# Patient Record
Sex: Male | Born: 1995
Health system: Southern US, Community
[De-identification: ages and names within clinical notes are randomized; demographics above are authoritative.]

## PROBLEM LIST (undated history)

## (undated) DIAGNOSIS — G4730 Sleep apnea, unspecified: Secondary | ICD-10-CM

## (undated) DIAGNOSIS — F419 Anxiety disorder, unspecified: Secondary | ICD-10-CM

## (undated) DIAGNOSIS — K219 Gastro-esophageal reflux disease without esophagitis: Secondary | ICD-10-CM

## (undated) DIAGNOSIS — F32A Depression, unspecified: Secondary | ICD-10-CM

## (undated) HISTORY — PX: NO PAST SURGERIES: SHX2092

## (undated) HISTORY — DX: Depression, unspecified: F32.A

## (undated) HISTORY — DX: Sleep apnea, unspecified: G47.30

## (undated) HISTORY — DX: Gastro-esophageal reflux disease without esophagitis: K21.9

## (undated) HISTORY — DX: Anxiety disorder, unspecified: F41.9

---
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
## 2017-01-10 DIAGNOSIS — F332 Major depressive disorder, recurrent severe without psychotic features: Secondary | ICD-10-CM | POA: Diagnosis not present

## 2017-02-17 DIAGNOSIS — F332 Major depressive disorder, recurrent severe without psychotic features: Secondary | ICD-10-CM | POA: Diagnosis not present

## 2017-03-17 DIAGNOSIS — F332 Major depressive disorder, recurrent severe without psychotic features: Secondary | ICD-10-CM | POA: Diagnosis not present

## 2018-03-13 DIAGNOSIS — D1722 Benign lipomatous neoplasm of skin and subcutaneous tissue of left arm: Secondary | ICD-10-CM | POA: Diagnosis not present

## 2018-03-13 DIAGNOSIS — D179 Benign lipomatous neoplasm, unspecified: Secondary | ICD-10-CM | POA: Diagnosis not present

## 2018-03-14 ENCOUNTER — Other Ambulatory Visit: Payer: Self-pay | Admitting: Physician Assistant

## 2018-03-14 DIAGNOSIS — D1722 Benign lipomatous neoplasm of skin and subcutaneous tissue of left arm: Secondary | ICD-10-CM

## 2018-03-17 ENCOUNTER — Ambulatory Visit
Admission: RE | Admit: 2018-03-17 | Discharge: 2018-03-17 | Disposition: A | Discharge status: Home / Self Care | Payer: BLUE CROSS/BLUE SHIELD | Source: Ambulatory Visit | Attending: Physician Assistant | Admitting: Physician Assistant

## 2018-03-17 DIAGNOSIS — D1722 Benign lipomatous neoplasm of skin and subcutaneous tissue of left arm: Secondary | ICD-10-CM

## 2018-03-17 DIAGNOSIS — R2231 Localized swelling, mass and lump, right upper limb: Secondary | ICD-10-CM | POA: Diagnosis not present

## 2018-05-25 DIAGNOSIS — J3489 Other specified disorders of nose and nasal sinuses: Secondary | ICD-10-CM | POA: Diagnosis not present

## 2018-05-25 DIAGNOSIS — R0989 Other specified symptoms and signs involving the circulatory and respiratory systems: Secondary | ICD-10-CM | POA: Diagnosis not present

## 2018-12-21 DIAGNOSIS — F419 Anxiety disorder, unspecified: Secondary | ICD-10-CM | POA: Diagnosis not present

## 2018-12-27 DIAGNOSIS — F4310 Post-traumatic stress disorder, unspecified: Secondary | ICD-10-CM | POA: Diagnosis not present

## 2019-01-11 DIAGNOSIS — F411 Generalized anxiety disorder: Secondary | ICD-10-CM | POA: Diagnosis not present

## 2019-01-11 DIAGNOSIS — F4310 Post-traumatic stress disorder, unspecified: Secondary | ICD-10-CM | POA: Diagnosis not present

## 2019-01-18 DIAGNOSIS — F4310 Post-traumatic stress disorder, unspecified: Secondary | ICD-10-CM | POA: Diagnosis not present

## 2019-01-18 DIAGNOSIS — F411 Generalized anxiety disorder: Secondary | ICD-10-CM | POA: Diagnosis not present

## 2019-01-25 DIAGNOSIS — F411 Generalized anxiety disorder: Secondary | ICD-10-CM | POA: Diagnosis not present

## 2019-01-25 DIAGNOSIS — F4310 Post-traumatic stress disorder, unspecified: Secondary | ICD-10-CM | POA: Diagnosis not present

## 2019-01-26 DIAGNOSIS — H60501 Unspecified acute noninfective otitis externa, right ear: Secondary | ICD-10-CM | POA: Diagnosis not present

## 2019-01-26 DIAGNOSIS — H6691 Otitis media, unspecified, right ear: Secondary | ICD-10-CM | POA: Diagnosis not present

## 2019-02-07 DIAGNOSIS — Z79891 Long term (current) use of opiate analgesic: Secondary | ICD-10-CM | POA: Diagnosis not present

## 2019-02-07 DIAGNOSIS — F411 Generalized anxiety disorder: Secondary | ICD-10-CM | POA: Diagnosis not present

## 2019-02-07 DIAGNOSIS — F3342 Major depressive disorder, recurrent, in full remission: Secondary | ICD-10-CM | POA: Diagnosis not present

## 2019-02-07 DIAGNOSIS — F4310 Post-traumatic stress disorder, unspecified: Secondary | ICD-10-CM | POA: Diagnosis not present

## 2019-02-08 DIAGNOSIS — F411 Generalized anxiety disorder: Secondary | ICD-10-CM | POA: Diagnosis not present

## 2019-02-08 DIAGNOSIS — F4310 Post-traumatic stress disorder, unspecified: Secondary | ICD-10-CM | POA: Diagnosis not present

## 2019-02-14 DIAGNOSIS — F4310 Post-traumatic stress disorder, unspecified: Secondary | ICD-10-CM | POA: Diagnosis not present

## 2019-02-14 DIAGNOSIS — F411 Generalized anxiety disorder: Secondary | ICD-10-CM | POA: Diagnosis not present

## 2019-02-21 DIAGNOSIS — F411 Generalized anxiety disorder: Secondary | ICD-10-CM | POA: Diagnosis not present

## 2019-02-21 DIAGNOSIS — F4310 Post-traumatic stress disorder, unspecified: Secondary | ICD-10-CM | POA: Diagnosis not present

## 2019-02-28 DIAGNOSIS — F411 Generalized anxiety disorder: Secondary | ICD-10-CM | POA: Diagnosis not present

## 2019-02-28 DIAGNOSIS — F4310 Post-traumatic stress disorder, unspecified: Secondary | ICD-10-CM | POA: Diagnosis not present

## 2019-03-07 DIAGNOSIS — F4310 Post-traumatic stress disorder, unspecified: Secondary | ICD-10-CM | POA: Diagnosis not present

## 2019-03-07 DIAGNOSIS — F411 Generalized anxiety disorder: Secondary | ICD-10-CM | POA: Diagnosis not present

## 2019-03-12 DIAGNOSIS — F3342 Major depressive disorder, recurrent, in full remission: Secondary | ICD-10-CM | POA: Diagnosis not present

## 2019-03-12 DIAGNOSIS — F411 Generalized anxiety disorder: Secondary | ICD-10-CM | POA: Diagnosis not present

## 2019-03-12 DIAGNOSIS — F4310 Post-traumatic stress disorder, unspecified: Secondary | ICD-10-CM | POA: Diagnosis not present

## 2019-03-28 DIAGNOSIS — F411 Generalized anxiety disorder: Secondary | ICD-10-CM | POA: Diagnosis not present

## 2019-03-28 DIAGNOSIS — F4310 Post-traumatic stress disorder, unspecified: Secondary | ICD-10-CM | POA: Diagnosis not present

## 2019-04-13 DIAGNOSIS — F411 Generalized anxiety disorder: Secondary | ICD-10-CM | POA: Diagnosis not present

## 2019-04-13 DIAGNOSIS — F4310 Post-traumatic stress disorder, unspecified: Secondary | ICD-10-CM | POA: Diagnosis not present

## 2019-04-27 DIAGNOSIS — F411 Generalized anxiety disorder: Secondary | ICD-10-CM | POA: Diagnosis not present

## 2019-04-27 DIAGNOSIS — F4310 Post-traumatic stress disorder, unspecified: Secondary | ICD-10-CM | POA: Diagnosis not present

## 2019-05-16 DIAGNOSIS — F4310 Post-traumatic stress disorder, unspecified: Secondary | ICD-10-CM | POA: Diagnosis not present

## 2019-05-16 DIAGNOSIS — F411 Generalized anxiety disorder: Secondary | ICD-10-CM | POA: Diagnosis not present

## 2019-05-30 DIAGNOSIS — F4310 Post-traumatic stress disorder, unspecified: Secondary | ICD-10-CM | POA: Diagnosis not present

## 2019-05-30 DIAGNOSIS — F411 Generalized anxiety disorder: Secondary | ICD-10-CM | POA: Diagnosis not present

## 2019-08-29 DIAGNOSIS — R1013 Epigastric pain: Secondary | ICD-10-CM | POA: Diagnosis not present

## 2019-08-29 DIAGNOSIS — R1310 Dysphagia, unspecified: Secondary | ICD-10-CM | POA: Diagnosis not present

## 2019-08-29 DIAGNOSIS — R12 Heartburn: Secondary | ICD-10-CM | POA: Diagnosis not present

## 2019-08-30 ENCOUNTER — Other Ambulatory Visit: Payer: Self-pay | Admitting: Family Medicine

## 2019-08-30 DIAGNOSIS — R1013 Epigastric pain: Secondary | ICD-10-CM

## 2019-09-11 ENCOUNTER — Ambulatory Visit
Admission: RE | Admit: 2019-09-11 | Discharge: 2019-09-11 | Disposition: A | Discharge status: Home / Self Care | Payer: BC Managed Care – PPO | Source: Ambulatory Visit | Attending: Family Medicine | Admitting: Family Medicine

## 2019-09-11 DIAGNOSIS — R109 Unspecified abdominal pain: Secondary | ICD-10-CM | POA: Diagnosis not present

## 2019-09-11 DIAGNOSIS — R1013 Epigastric pain: Secondary | ICD-10-CM

## 2019-09-12 DIAGNOSIS — R1013 Epigastric pain: Secondary | ICD-10-CM | POA: Diagnosis not present

## 2019-10-31 DIAGNOSIS — M7751 Other enthesopathy of right foot: Secondary | ICD-10-CM | POA: Diagnosis not present

## 2019-10-31 DIAGNOSIS — L600 Ingrowing nail: Secondary | ICD-10-CM | POA: Diagnosis not present

## 2019-10-31 DIAGNOSIS — M79671 Pain in right foot: Secondary | ICD-10-CM | POA: Diagnosis not present

## 2019-11-15 DIAGNOSIS — M79675 Pain in left toe(s): Secondary | ICD-10-CM | POA: Diagnosis not present

## 2019-11-15 DIAGNOSIS — L600 Ingrowing nail: Secondary | ICD-10-CM | POA: Diagnosis not present

## 2019-11-15 DIAGNOSIS — M79674 Pain in right toe(s): Secondary | ICD-10-CM | POA: Diagnosis not present

## 2019-11-15 DIAGNOSIS — B351 Tinea unguium: Secondary | ICD-10-CM | POA: Diagnosis not present

## 2019-12-13 DIAGNOSIS — M79675 Pain in left toe(s): Secondary | ICD-10-CM | POA: Diagnosis not present

## 2019-12-13 DIAGNOSIS — M79674 Pain in right toe(s): Secondary | ICD-10-CM | POA: Diagnosis not present

## 2019-12-13 DIAGNOSIS — L600 Ingrowing nail: Secondary | ICD-10-CM | POA: Diagnosis not present

## 2019-12-13 DIAGNOSIS — B351 Tinea unguium: Secondary | ICD-10-CM | POA: Diagnosis not present

## 2020-01-10 DIAGNOSIS — B351 Tinea unguium: Secondary | ICD-10-CM | POA: Diagnosis not present

## 2020-01-10 DIAGNOSIS — L600 Ingrowing nail: Secondary | ICD-10-CM | POA: Diagnosis not present

## 2020-01-10 DIAGNOSIS — M79674 Pain in right toe(s): Secondary | ICD-10-CM | POA: Diagnosis not present

## 2020-01-10 DIAGNOSIS — M79675 Pain in left toe(s): Secondary | ICD-10-CM | POA: Diagnosis not present

## 2020-01-24 IMAGING — US US EXTREM UP*L* LTD
1 series · 5 of 5 positions shown · non-contrast
Comparison: None.

CLINICAL DATA: Medial upper arm mass.

EXAM:
ULTRASOUND LEFT UPPER EXTREMITY LIMITED
TECHNIQUE: Ultrasound examination of the upper extremity soft tissues was
performed in the area of clinical concern.

[Series 1: us extrem up*left* ltd · 0.05mm/px · 5 of 5 slices shown]
[im 1/5]
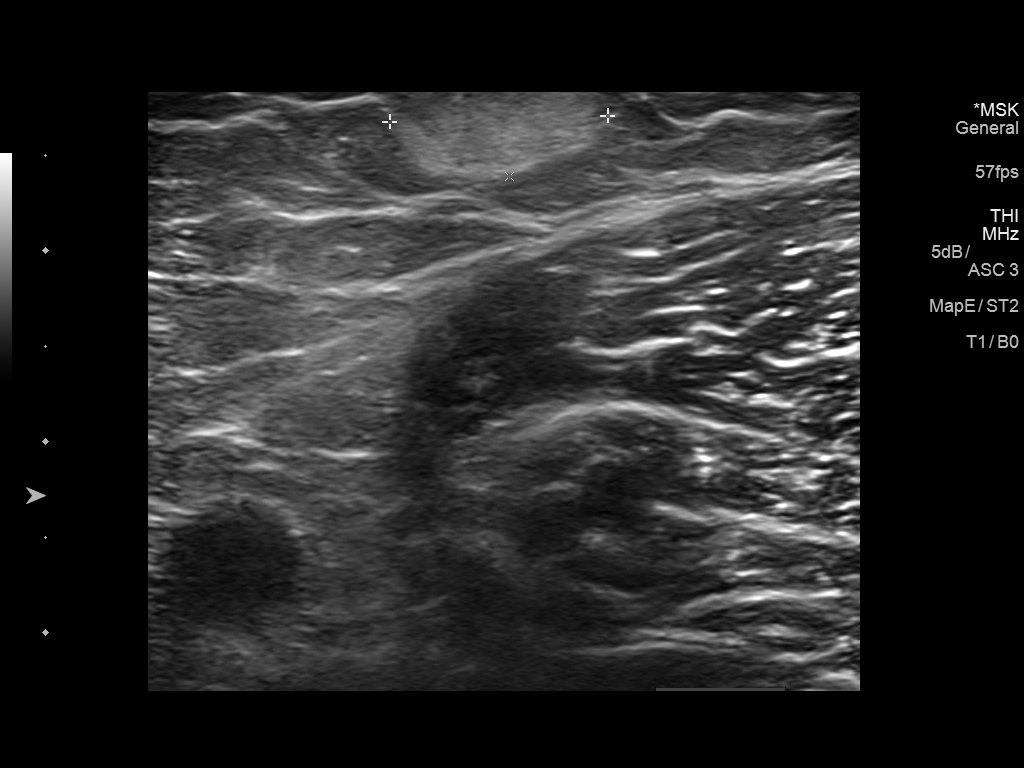
[im 2/5]
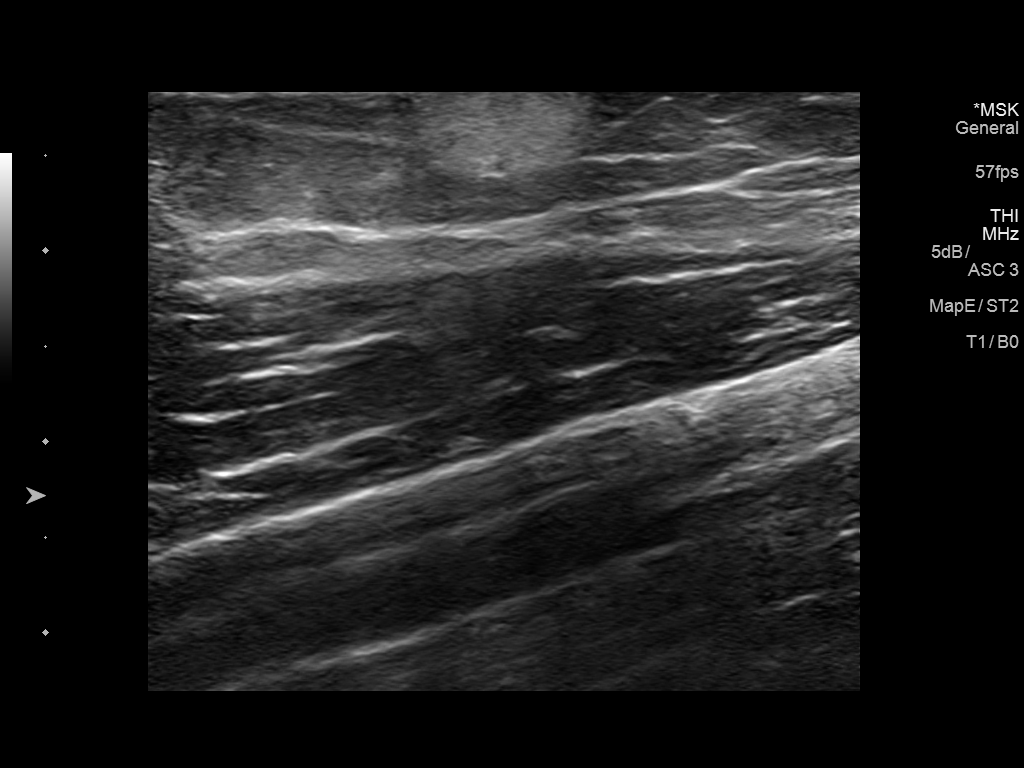
[im 3/5]
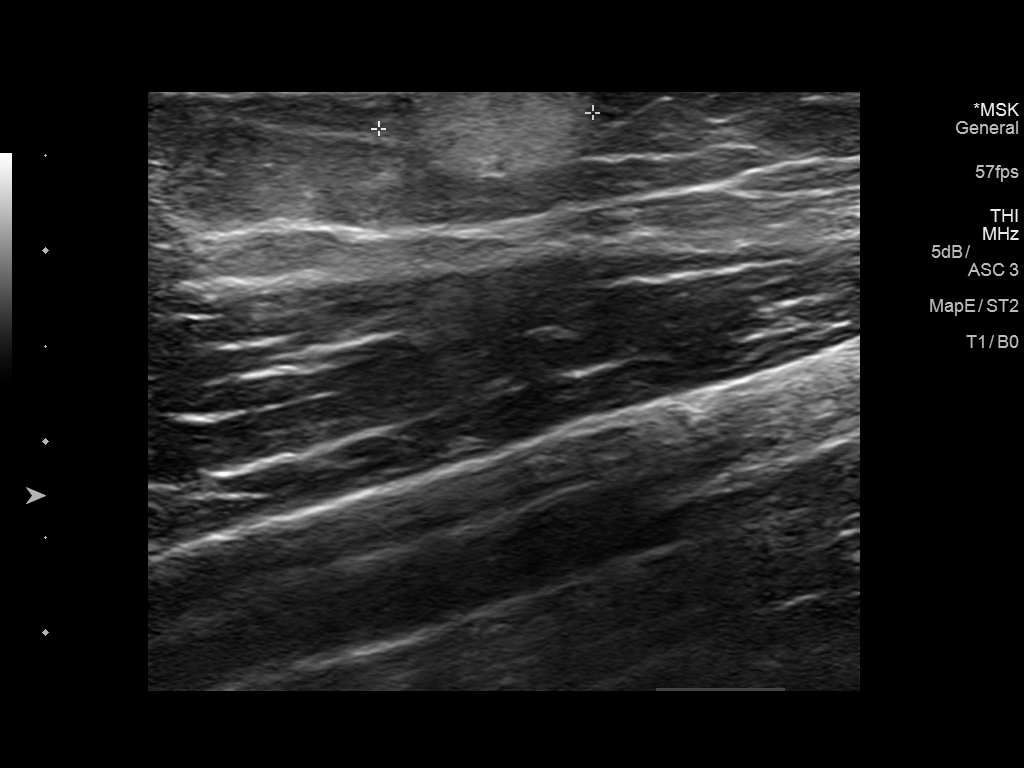
[im 4/5]
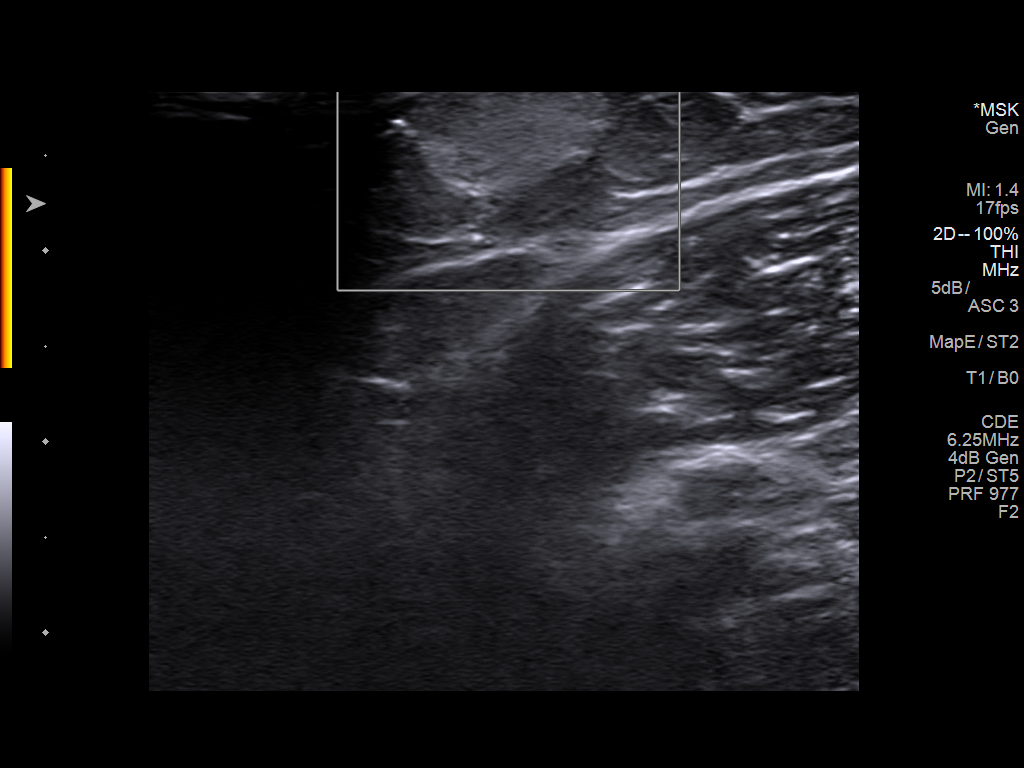
[im 5/5]
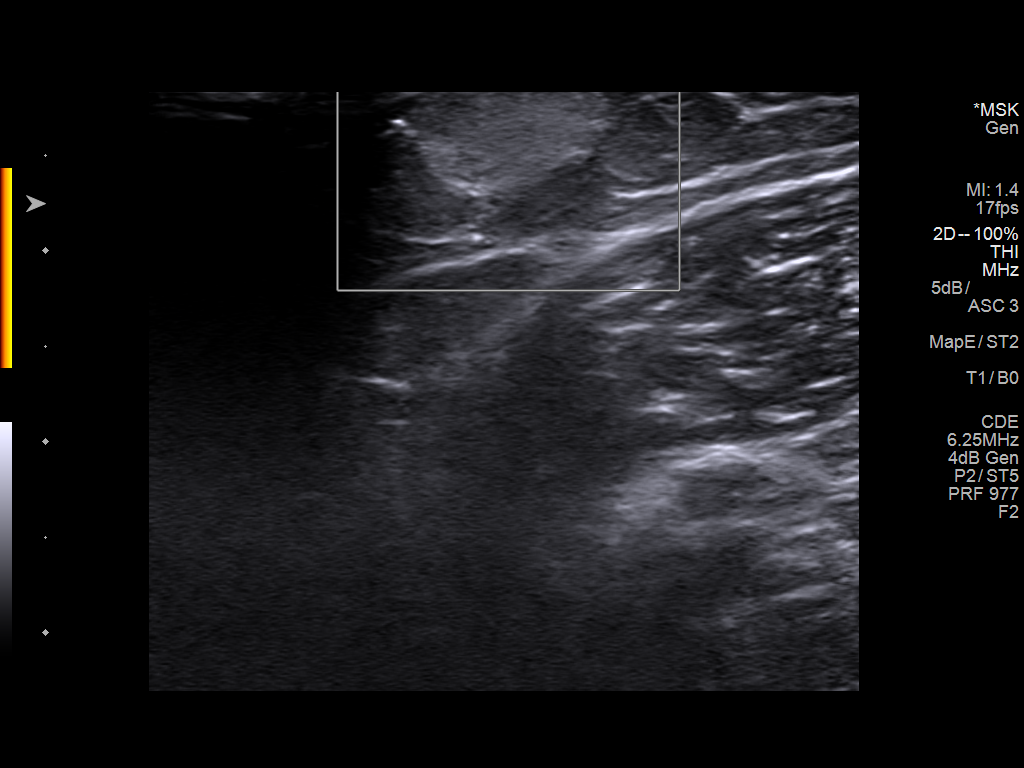

[5 of 5 positions shown; findings below may reference images not displayed]

FINDINGS: Focused ultrasound in the area of clinical concern along the distal
left medial upper arm demonstrates a well-defined 1.1 x 0.5 x 1.1 cm
echogenic mass in the superficial subcutaneous fat, just deep to the
skin. There is no internal vascularity.
IMPRESSION: 1. 1.1 cm mass in the superficial subcutaneous fat along the distal
left medial upper arm. Sonographic appearance is most consistent
with a lipoma.

## 2020-02-07 DIAGNOSIS — M79674 Pain in right toe(s): Secondary | ICD-10-CM | POA: Diagnosis not present

## 2020-02-07 DIAGNOSIS — B351 Tinea unguium: Secondary | ICD-10-CM | POA: Diagnosis not present

## 2020-02-07 DIAGNOSIS — L600 Ingrowing nail: Secondary | ICD-10-CM | POA: Diagnosis not present

## 2020-02-07 DIAGNOSIS — M79675 Pain in left toe(s): Secondary | ICD-10-CM | POA: Diagnosis not present

## 2020-02-13 DIAGNOSIS — J069 Acute upper respiratory infection, unspecified: Secondary | ICD-10-CM | POA: Diagnosis not present

## 2020-02-13 DIAGNOSIS — Z03818 Encounter for observation for suspected exposure to other biological agents ruled out: Secondary | ICD-10-CM | POA: Diagnosis not present

## 2020-10-12 IMAGING — US US ABDOMEN LIMITED
1 series · 14 of 25 positions shown · non-contrast
Comparison: No prior.

CLINICAL DATA: Abdominal pain.

EXAM:
ULTRASOUND ABDOMEN LIMITED RIGHT UPPER QUADRANT

[Series 1: us abdomen limited · 0.26mm/px · 14 of 63 slices shown]
[im 1/63]
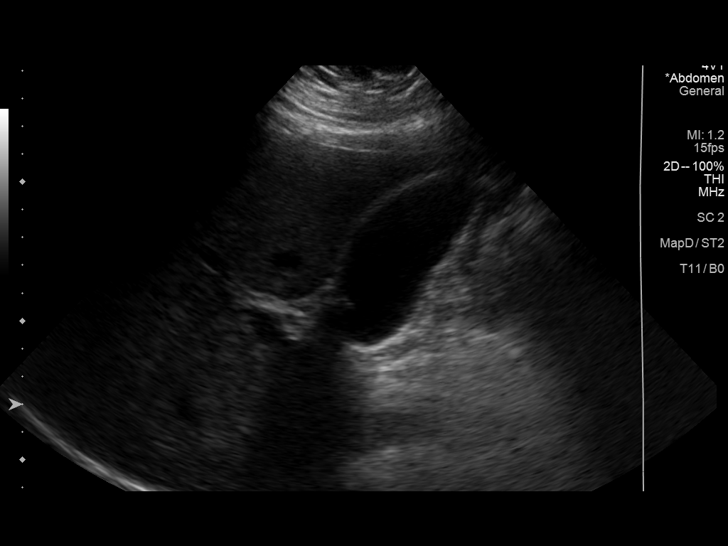
[im 6/63]
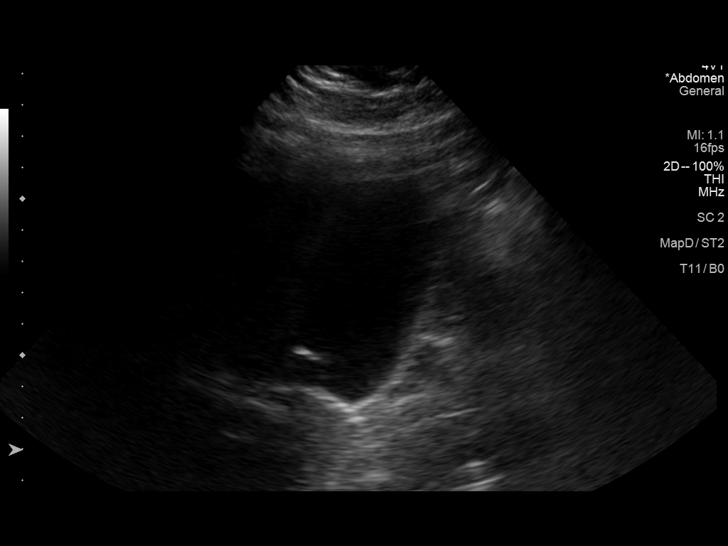
[im 11/63]
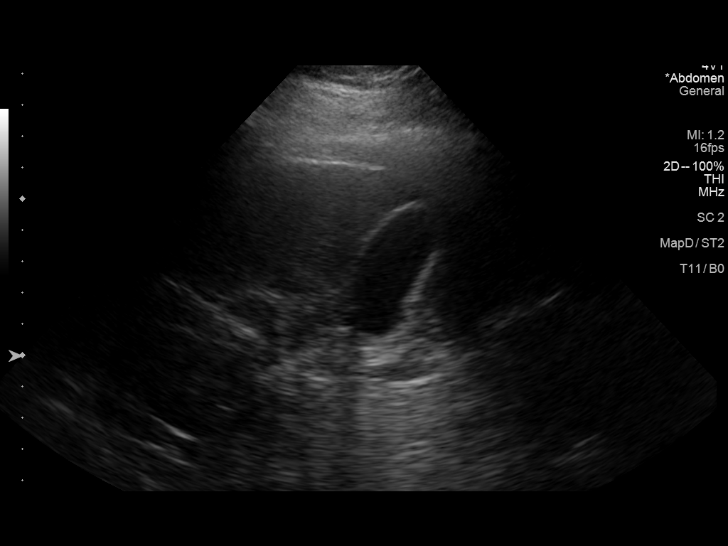
[im 16/63]
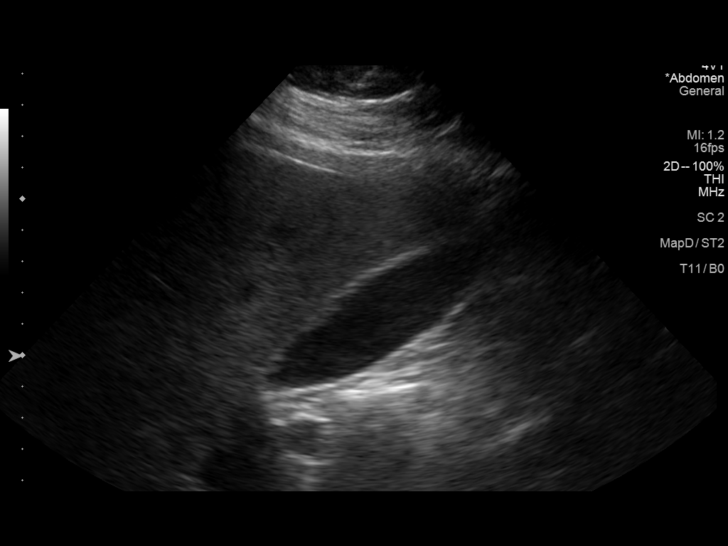
[im 21/63]
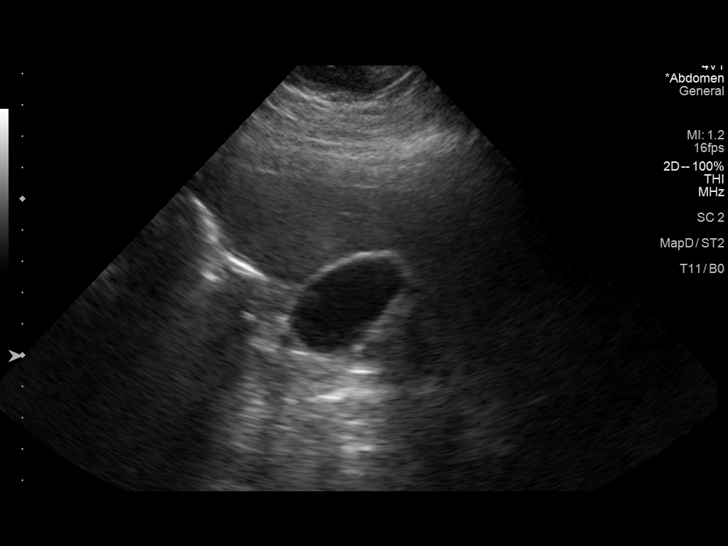
[im 24/63]
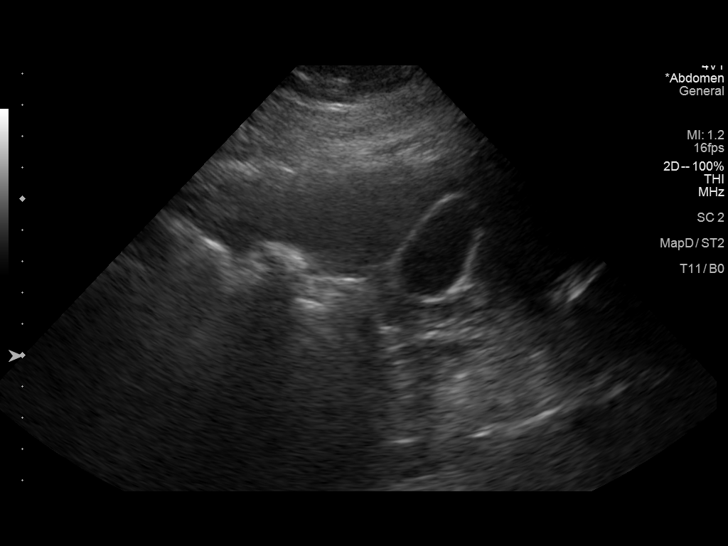
[im 29/63]
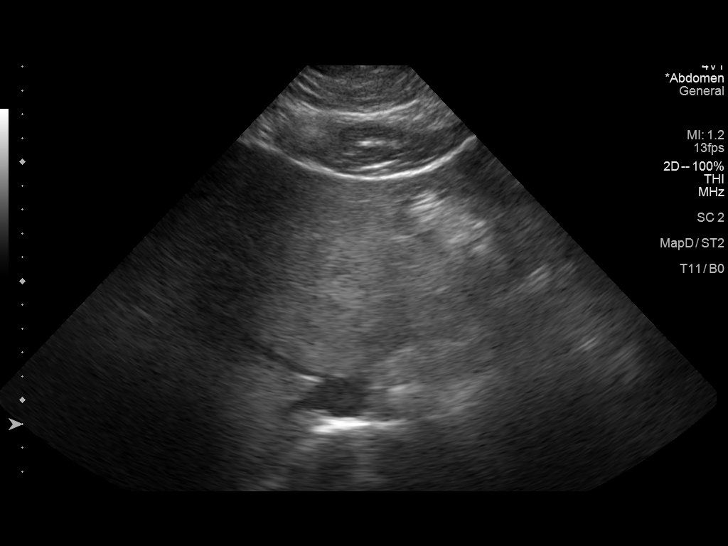
[im 34/63]
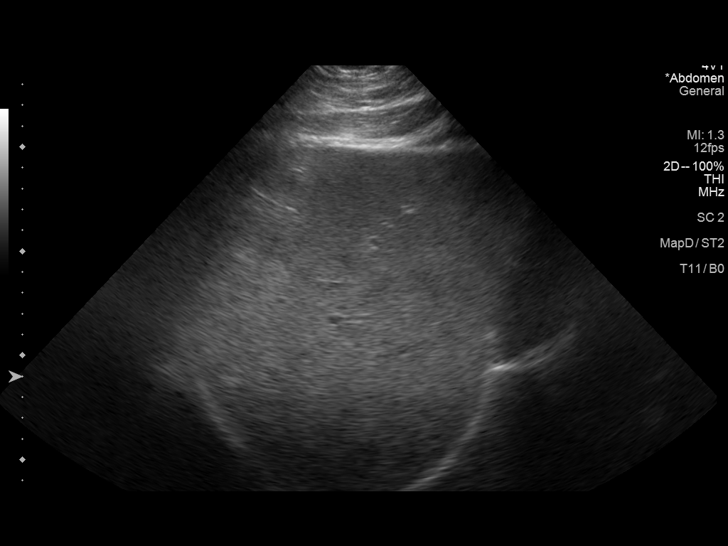
[im 39/63]
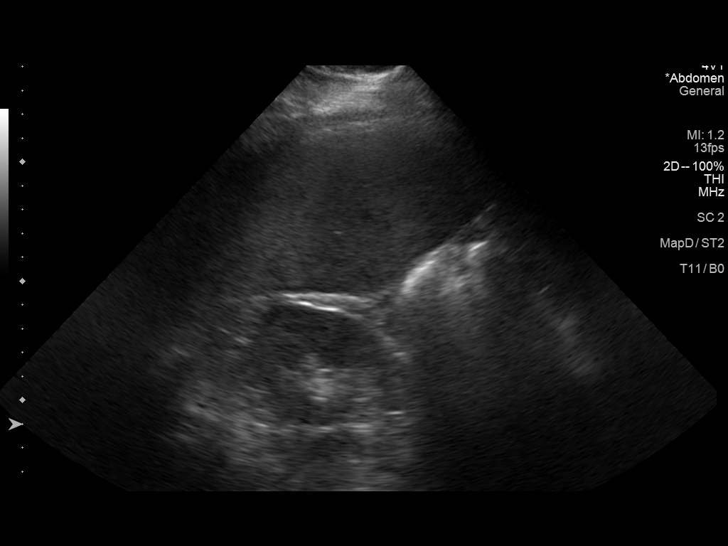
[im 42/63]
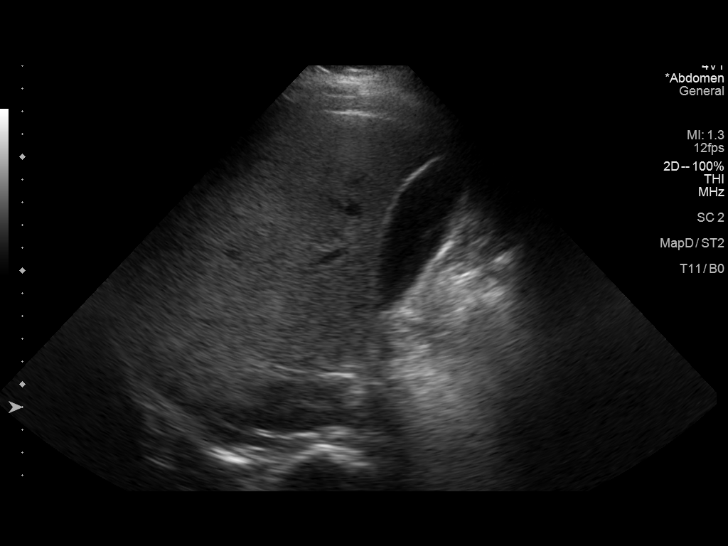
[im 47/63]
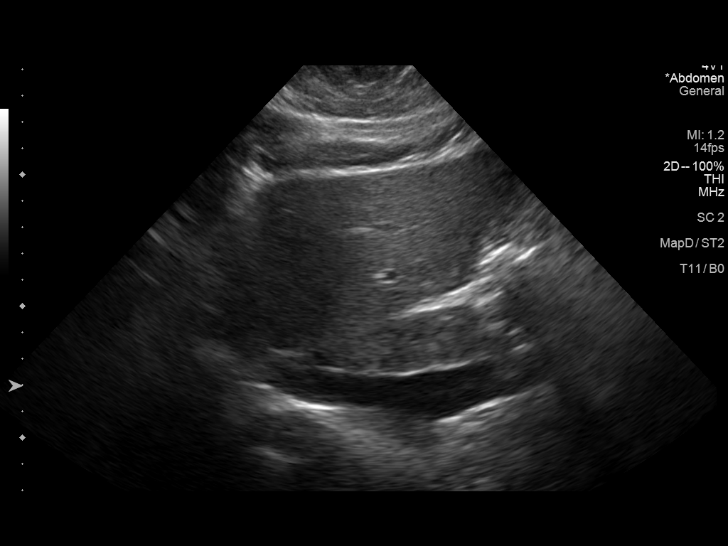
[im 52/63]
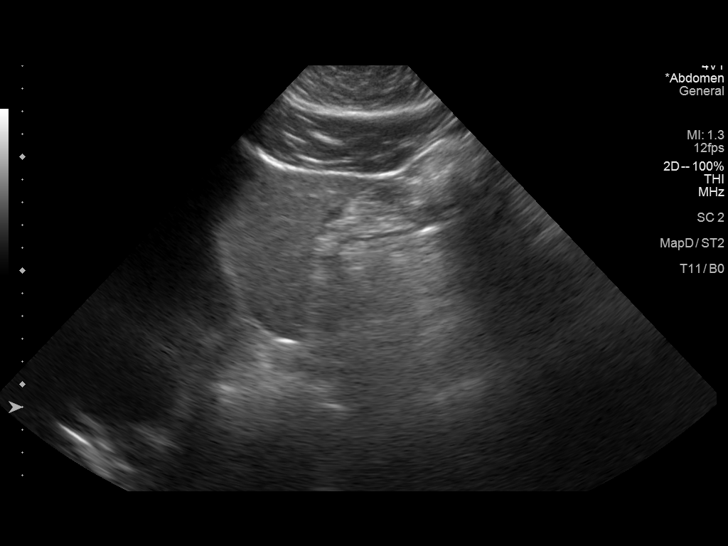
[im 57/63]
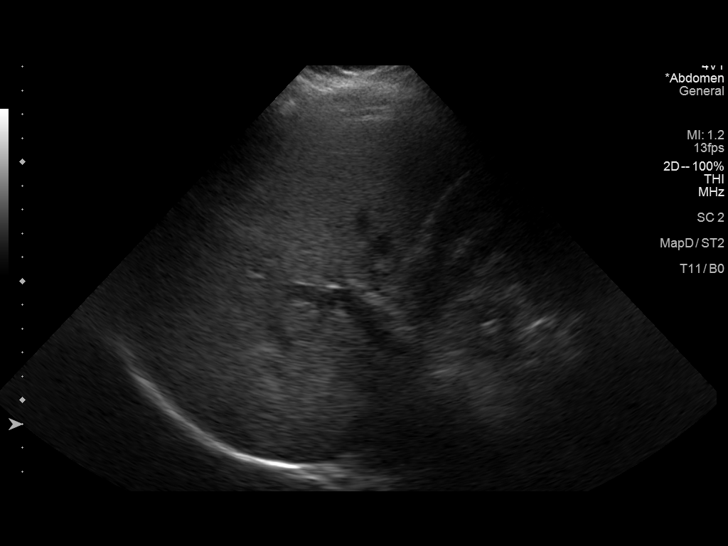
[im 63/63]
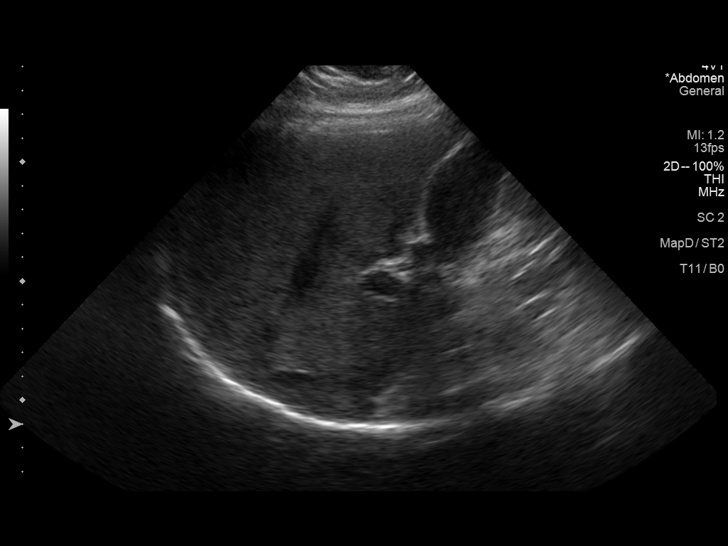

[14 of 25 positions shown; findings below may reference images not displayed]

FINDINGS: Gallbladder:

No gallstones or wall thickening visualized. No sonographic Murphy
sign noted by sonographer.

Common bile duct:

Diameter: 2.7 mm

Liver:

No focal lesion identified. Within normal limits in parenchymal
echogenicity. Portal vein is patent on color Doppler imaging with
normal direction of blood flow towards the liver.

Other: None.
IMPRESSION: No gallstones or biliary distention. No acute abnormality
identified.

## 2021-05-23 ENCOUNTER — Emergency Department (HOSPITAL_COMMUNITY)
Admission: EM | Admit: 2021-05-23 | Discharge: 2021-05-23 | Disposition: A | Discharge status: Home / Self Care | Payer: 59 | Attending: Emergency Medicine | Admitting: Emergency Medicine

## 2021-05-23 ENCOUNTER — Encounter (HOSPITAL_COMMUNITY): Payer: Self-pay

## 2021-05-23 ENCOUNTER — Other Ambulatory Visit: Payer: Self-pay

## 2021-05-23 DIAGNOSIS — R1013 Epigastric pain: Secondary | ICD-10-CM | POA: Insufficient documentation

## 2021-05-23 DIAGNOSIS — R109 Unspecified abdominal pain: Secondary | ICD-10-CM

## 2021-05-23 HISTORY — DX: Gastro-esophageal reflux disease without esophagitis: K21.9

## 2021-05-23 LAB — COMPREHENSIVE METABOLIC PANEL
ALT: 19 U/L (ref 0–44)
AST: 13 U/L — ABNORMAL LOW (ref 15–41)
Albumin: 4.4 g/dL (ref 3.5–5.0)
Alkaline Phosphatase: 40 U/L (ref 38–126)
Anion gap: 8 (ref 5–15)
BUN: 12 mg/dL (ref 6–20)
CO2: 23 mmol/L (ref 22–32)
Calcium: 9.1 mg/dL (ref 8.9–10.3)
Chloride: 105 mmol/L (ref 98–111)
Creatinine, Ser: 0.8 mg/dL (ref 0.61–1.24)
GFR, Estimated: >60 mL/min (ref >60)
Glucose, Bld: 89 mg/dL (ref 70–99)
Potassium: 3.7 mmol/L (ref 3.5–5.1)
Sodium: 136 mmol/L (ref 135–145)
Total Bilirubin: 0.3 mg/dL (ref 0.3–1.2)
Total Protein: 7.7 g/dL (ref 6.5–8.1)

## 2021-05-23 LAB — CBC WITH DIFFERENTIAL/PLATELET
Abs Immature Granulocytes: 0.02 10*3/uL (ref 0.00–0.07)
Basophils Absolute: 0 10*3/uL (ref 0.0–0.1)
Basophils Relative: 1 %
Eosinophils Absolute: 0.3 10*3/uL (ref 0.0–0.5)
Eosinophils Relative: 4 %
HCT: 45.6 % (ref 39.0–52.0)
Hemoglobin: 15.5 g/dL (ref 13.0–17.0)
Immature Granulocytes: 0 %
Lymphocytes Relative: 36 %
Lymphs Abs: 2.9 10*3/uL (ref 0.7–4.0)
MCH: 29.6 pg (ref 26.0–34.0)
MCHC: 34 g/dL (ref 30.0–36.0)
MCV: 87 fL (ref 80.0–100.0)
Monocytes Absolute: 0.6 10*3/uL (ref 0.1–1.0)
Monocytes Relative: 7 %
Neutro Abs: 4.2 10*3/uL (ref 1.7–7.7)
Neutrophils Relative %: 52 %
Platelets: 213 10*3/uL (ref 150–400)
RBC: 5.24 MIL/uL (ref 4.22–5.81)
RDW: 13 % (ref 11.5–15.5)
WBC: 8 10*3/uL (ref 4.0–10.5)
nRBC: 0 % (ref 0.0–0.2)

## 2021-05-23 LAB — LIPASE, BLOOD: Lipase: 31 U/L (ref 11–51)

## 2021-05-23 MED ORDER — SUCRALFATE 1 G PO TABS: 1.0000 g | ORAL_TABLET | Freq: Four times a day (QID) | ORAL | 0 refills | Status: DC

## 2021-05-23 MED ORDER — ALUM & MAG HYDROXIDE-SIMETH 200-200-20 MG/5ML PO SUSP
30.0000 mL | Freq: Once | ORAL | Status: AC
  Administered 2021-05-23: 30 mL via ORAL
  Filled 2021-05-23: qty 30

## 2021-05-23 MED ORDER — LIDOCAINE VISCOUS HCL 2 % MT SOLN
15.0000 mL | Freq: Once | OROMUCOSAL | Status: AC
  Administered 2021-05-23: 15 mL via ORAL
  Filled 2021-05-23: qty 15

## 2021-05-23 MED ORDER — DICYCLOMINE HCL 20 MG PO TABS: 20.0000 mg | ORAL_TABLET | Freq: Two times a day (BID) | ORAL | 0 refills | Status: DC

## 2021-05-23 NOTE — ED Provider Notes (Signed)
?[facility] DEPT ?Provider Note ? ? ?CSN: [id number] ?Arrival date & time: 05/23/21  1549 ? ?  ? ?History ? ?Chief Complaint  ?Patient presents with  ? Abdominal Pain  ? ? ?Meshach Grewe is a 26 y.o. male. ? ?26 year old male presents with epigastric pain which has been waxing waning for quite some time.  States his pain is getting worse at night and characterizes it as sharp discomfort.  Did think that it was associated with food but notes that no improvement with change in diet.  Was taking omeprazole for this in the past as he states he has history of reflux but states this is also not been helping.  Denies any emesis at this time.  No fever or chills. ? ? ?  ? ?Home Medications ?Prior to Admission medications   ?Not on File  ?   ? ?Allergies    ?Patient has no known allergies.   ? ?Review of Systems   ?Review of Systems  ?All other systems reviewed and are negative. ? ?Physical Exam ?Updated Vital Signs ?BP (!) 147/94 (BP Location: Right Arm)   Pulse 74   Temp 98.5 ?F (36.9 ?C) (Oral)   Resp 16   Ht 1.803 m ('5\' 11"'$ )   Wt 131.5 kg   SpO2 99%   BMI 40.45 kg/m?  ?Physical Exam ?Vitals and nursing note reviewed.  ?Constitutional:   ?   General: He is not in acute distress. ?   Appearance: Normal appearance. He is well-developed. He is not toxic-appearing.  ?HENT:  ?   Head: Normocephalic and atraumatic.  ?Eyes:  ?   General: Lids are normal.  ?   Conjunctiva/sclera: Conjunctivae normal.  ?   Pupils: Pupils are equal, round, and reactive to light.  ?Neck:  ?   Thyroid: No thyroid mass.  ?   Trachea: No tracheal deviation.  ?Cardiovascular:  ?   Rate and Rhythm: Normal rate and regular rhythm.  ?   Heart sounds: Normal heart sounds. No murmur heard. ?  No gallop.  ?Pulmonary:  ?   Effort: Pulmonary effort is normal. No respiratory distress.  ?   Breath sounds: Normal breath sounds. No stridor. No decreased breath sounds, wheezing, rhonchi or rales.  ?Abdominal:  ?   General: There is  no distension.  ?   Palpations: Abdomen is soft.  ?   Tenderness: There is no abdominal tenderness. There is no rebound.  ?Musculoskeletal:     ?   General: No tenderness. Normal range of motion.  ?   Cervical back: Normal range of motion and neck supple.  ?Skin: ?   General: Skin is warm and dry.  ?   Findings: No abrasion or rash.  ?Neurological:  ?   Mental Status: He is alert and oriented to person, place, and time. Mental status is at baseline.  ?   GCS: GCS eye subscore is 4. GCS verbal subscore is 5. GCS motor subscore is 6.  ?   Cranial Nerves: No cranial nerve deficit.  ?   Sensory: No sensory deficit.  ?   Motor: Motor function is intact.  ?Psychiatric:     ?   Attention and Perception: Attention normal.     ?   Speech: Speech normal.     ?   Behavior: Behavior normal.  ? ? ?ED Results / Procedures / Treatments   ?Labs ?(all labs ordered are listed, but only abnormal results are displayed) ?Labs Reviewed  ?COMPREHENSIVE METABOLIC  PANEL - Abnormal; Notable for the following components:  ?    Result Value  ? AST 13 (*)   ? All other components within normal limits  ?CBC WITH DIFFERENTIAL/PLATELET  ?LIPASE, BLOOD  ?URINALYSIS, ROUTINE W REFLEX MICROSCOPIC  ? ? ?EKG ?None ? ?Radiology ?No results found. ? ?Procedures ?Procedures  ? ? ?Medications Ordered in ED ?Medications  ?alum & mag hydroxide-simeth (MAALOX/MYLANTA) 200-200-20 MG/5ML suspension 30 mL (30 mLs Oral Given 05/23/21 1620)  ?  And  ?lidocaine (XYLOCAINE) 2 % viscous mouth solution 15 mL (15 mLs Oral Given 05/23/21 1621)  ? ? ?ED Course/ Medical Decision Making/ A&P ?  ?                        ?Medical Decision Making ? ?Patient presented with epigastric pain which is been waxing and waning.  His abdominal exam is benign at this time.  Considered diseases such as reflux, perforated ulcer, pancreatitis.  Labs here are overall reassuring.  Suspect he does have some GI etiology of his symptoms.  Will place on Carafate and add Bentyl and follow-up with  his primary care doctor.  Patient agreeable to this and no indication for admission ? ? ? ? ? ? ? ?Final Clinical Impression(s) / ED Diagnoses ?Final diagnoses:  ?None  ? ? ?Rx / DC Orders ?ED Discharge Orders   ? ? None  ? ?  ? ? ?  ?Lacretia Leigh, MD ?05/23/21 1807 ? ?

## 2021-05-23 NOTE — ED Triage Notes (Signed)
Pt c/o epigastric pain x several months. Pt was given prilosec and omeprazole for reflux without relief. Pt states he was dx with reflux, but changing diet has not helped. Pt states mild diarrhea, denies N/V. ?

## 2021-05-23 NOTE — ED Provider Triage Note (Signed)
Emergency Medicine Provider Triage Evaluation Note ? ?Bryce Nguyen , a 26 y.o. male  was evaluated in triage.  Pt complains of abd pain. ? ?Review of Systems  ?Positive: Upper abd pain, burning sensation, mild headache, acid taste ?Negative: Fever, cough, vomiting, diarrhea ? ?Physical Exam  ?BP (!) 147/94 (BP Location: Right Arm)   Pulse 74   Temp 98.5 ?F (36.9 ?C) (Oral)   Resp 16   Ht '5\' 11"'$  (1.803 m)   Wt 131.5 kg   SpO2 99%   BMI 40.45 kg/m?  ?Gen:   Awake, no distress   ?Resp:  Normal effort  ?MSK:   Moves extremities without difficulty  ?Other:   ? ?Medical Decision Making  ?Medically screening exam initiated at 4:07 PM.  Appropriate orders placed.  [name] was informed that the remainder of the evaluation will be completed by another provider, this initial triage assessment does not replace that evaluation, and the importance of remaining in the ED until their evaluation is complete. ? ?Recurrent epigastric pain, suggestive of GERD, but worsening the past 2 days. Minimal relief with prilosec. ?  ?[name], PA-C ?05/23/21 1612 ? ?

## 2021-07-15 ENCOUNTER — Encounter: Payer: Self-pay | Admitting: Gastroenterology

## 2021-08-07 ENCOUNTER — Ambulatory Visit: Payer: 59 | Admitting: Gastroenterology

## 2021-08-07 ENCOUNTER — Encounter: Payer: Self-pay | Admitting: Gastroenterology

## 2021-08-07 VITALS — BP 120/80 | HR 68 | Ht 71.0 in | Wt 281.0 lb

## 2021-08-07 DIAGNOSIS — R1013 Epigastric pain: Secondary | ICD-10-CM

## 2021-08-07 NOTE — Progress Notes (Signed)
[facility] Consult Note:  History: Bryce Nguyen 08/07/2021  Referring provider: Linna Darner, MD Dianna Rossetti  [phone] fax  Reason for consult/chief complaint: Abdominal Pain (Last 6-12 months patient has been having increasing abdominal pain/digestive issues. He states some things worse, other don't so he is unable to pin point any irritations.)   Subjective  HPI:  Bryce Nguyen is a very pleasant 26 year old man referred by the primary care provider at his work (noted above) for chronic upper abdominal pain.  Bryce Nguyen was in the [facility] long ED on 05/23/2021 for abdominal pain.  ED provider note including the following: "26 year old male presents with epigastric pain which has been waxing waning for quite some time.  States his pain is getting worse at night and characterizes it as sharp discomfort.  Did think that it was associated with food but notes that no improvement with change in diet.  Was taking omeprazole for this in the past as he states he has history of reflux but states this is also not been helping.  Denies any emesis at this time.  No fever or chills. " Testing as noted below.  Prescribed Carafate and Bentyl.  Next describes about 2 years of intermittent upper abdominal sharp and burning pain that is waxing and waning, mostly episodic such as the visit that brought him to the ED in March.  When those flares occur he may also have some throat burning and tightness.  Acid suppression does not seem to have helped much and he has lately been trying to wean off it.  He has also not noticed any clear or consistent triggers.  He has noticed a coincident increase in anxiety with these flares, but cannot determine if they are cause or result. Episodes are not characterized by allergic symptoms with rash tongue or lip swelling.  He denies episodes of painful eye redness or joint inflammation.  No known family history of sprue or IBD. He had an H. pylori "quick test" at PCP  office that he recalls was negative. ROS:  Review of Systems  Constitutional:  Negative for appetite change and unexpected weight change.  HENT:  Negative for mouth sores and voice change.   Eyes:  Negative for pain and redness.  Respiratory:  Negative for cough and shortness of breath.   Cardiovascular:  Negative for chest pain and palpitations.  Genitourinary:  Negative for dysuria and hematuria.  Musculoskeletal:  Negative for arthralgias and myalgias.  Skin:  Negative for pallor and rash.  Neurological:  Negative for weakness and headaches.  Hematological:  Negative for adenopathy.  Psychiatric/Behavioral:         Mood stable.  Was taking sertraline and switch to Wellbutrin sometime last year.    Past Medical History: Past Medical History:  Diagnosis Date   Acid reflux    Anxiety    Depression    GERD (gastroesophageal reflux disease)    Sleep apnea      Past Surgical History: Past Surgical History:  Procedure Laterality Date   NO PAST SURGERIES       Family History: Family History  Problem Relation Age of Onset   Colon polyps Neg Hx    Colon cancer Neg Hx    Stomach cancer Neg Hx    Esophageal cancer Neg Hx     Social History: Social History   Socioeconomic History   Marital status: Single    Spouse name: Not on file   Number of children: Not on file   Years of education:  Not on file   Highest education level: Not on file  Occupational History   Not on file  Tobacco Use   Smoking status: Some Days    Types: Cigars   Smokeless tobacco: Never  Vaping Use   Vaping Use: Former  Substance and Sexual Activity   Alcohol use: Yes    Comment: social   Drug use: Never   Sexual activity: Not on file  Other Topics Concern   Not on file  Social History Narrative   Not on file   Social Determinants of Health   Financial Resource Strain: Not on file  Food Insecurity: Not on file  Transportation Needs: Not on file  Physical Activity: Not on file   Stress: Not on file  Social Connections: Not on file    Allergies: No Known Allergies  Outpatient Meds: Current Outpatient Medications  Medication Sig Dispense Refill   buPROPion (WELLBUTRIN XL) 150 MG 24 hr tablet Take 150 mg by mouth every morning.     pantoprazole (PROTONIX) 20 MG tablet Take 20 mg by mouth daily.     No current facility-administered medications for this visit.      ___________________________________________________________________ Objective   Exam:  BP 120/80   Pulse 68   Ht '5\' 11"'$  (1.803 m)   Wt 281 lb (127.5 kg)   BMI 39.19 kg/m  Wt Readings from Last 3 Encounters:  08/07/21 281 lb (127.5 kg)  05/23/21 290 lb (131.5 kg)    General: Well-appearing, normal vocal quality Eyes: sclera anicteric, no redness ENT: oral mucosa moist without lesions, no cervical or supraclavicular lymphadenopathy.  Normal dentition CV: Regular without murmur, no JVD, no peripheral edema Resp: clear to auscultation bilaterally, normal RR and effort noted GI: soft, no tenderness, with active bowel sounds. No guarding or palpable organomegaly noted.  Obese Skin; warm and dry, no rash or jaundice noted Neuro: awake, alert and oriented x 3. Normal gross motor function and fluent speech  Labs:     Latest Ref Rng & Units 05/23/2021    4:25 PM  CBC  WBC 4.0 - 10.5 K/uL 8.0    Hemoglobin 13.0 - 17.0 g/dL 15.5    Hematocrit 39.0 - 52.0 % 45.6    Platelets 150 - 400 K/uL 213        Latest Ref Rng & Units 05/23/2021    4:25 PM  CMP  Glucose 70 - 99 mg/dL 89    BUN 6 - 20 mg/dL 12    Creatinine 0.61 - 1.24 mg/dL 0.80    Sodium 135 - 145 mmol/L 136    Potassium 3.5 - 5.1 mmol/L 3.7    Chloride 98 - 111 mmol/L 105    CO2 22 - 32 mmol/L 23    Calcium 8.9 - 10.3 mg/dL 9.1    Total Protein 6.5 - 8.1 g/dL 7.7    Total Bilirubin 0.3 - 1.2 mg/dL 0.3    Alkaline Phos 38 - 126 U/L 40    AST 15 - 41 U/L 13    ALT 0 - 44 U/L 19     Lipase     Component Value Date/Time    LIPASE 31 05/23/2021 1625     Radiologic Studies: No recent abdominal imaging.  RUQ abdominal ultrasound from 09/11/2019:  CLINICAL DATA:  Abdominal pain.   EXAM: ULTRASOUND ABDOMEN LIMITED RIGHT UPPER QUADRANT   COMPARISON:  No prior.   FINDINGS: Gallbladder:   No gallstones or wall thickening visualized. No sonographic Murphy sign noted  by sonographer.   Common bile duct:   Diameter: 2.7 mm   Liver:   No focal lesion identified. Within normal limits in parenchymal echogenicity. Portal vein is patent on color Doppler imaging with normal direction of blood flow towards the liver.   Other: None.   IMPRESSION: No gallstones or biliary distention. No acute abnormality identified.     Electronically Signed   By: Dennis Port   On: 09/11/2019 11:40   Assessment: Encounter Diagnosis  Name Primary?   Abdominal pain, epigastric Yes    Somewhat difficult to characterize.  It is low-grade on and off and then has "flares" without clear triggers.  He does feel that anxiety is coincident with these flares but uncertain if they are cause or effect.  No gallstones when this problem for started about 2 years ago, but could have developed since then.  Consider H. pylori, biliary dyskinesia, less likely sprue or neoplasia.  No allergic symptoms to suggest eosinophilic condition.  Plan:  I recommended an upper endoscopy with biopsies and he is agreeable after thorough discussion of procedure and risks with diagram of anatomy shown.  The benefits and risks of the planned procedure were described in detail with the patient or (when appropriate) their health care proxy.  Risks were outlined as including, but not limited to, bleeding, infection, perforation, adverse medication reaction leading to cardiac or pulmonary decompensation, pancreatitis (if ERCP).  The limitation of incomplete mucosal visualization was also discussed.  No guarantees or warranties were given.   Thank  you for the courtesy of this consult.  Please call me with any questions or concerns.  [name]  CC: Dr. Linna Darner (above)

## 2021-08-07 NOTE — Patient Instructions (Signed)
If you are age 24 or older, your body mass index should be between 23-30. Your Body mass index is 39.19 kg/m. If this is out of the aforementioned range listed, please consider follow up with your Primary Care Provider.  If you are age 24 or younger, your body mass index should be between 19-25. Your Body mass index is 39.19 kg/m. If this is out of the aformentioned range listed, please consider follow up with your Primary Care Provider.   ________________________________________________________  The [facility] providers would like to encourage you to use [facility] to communicate with providers for non-urgent requests or questions.  Due to long hold times on the telephone, sending your provider a message by [facility] may be a faster and more efficient way to get a response.  Please allow 48 business hours for a response.  Please remember that this is for non-urgent requests.  _______________________________________________________  Bryce Nguyen have been scheduled for an endoscopy. Please follow written instructions given to you at your visit today. If you use inhalers (even only as needed), please bring them with you on the day of your procedure.  Due to recent changes in healthcare laws, you may see the results of your imaging and laboratory studies on MyChart before your provider has had a chance to review them.  We understand that in some cases there may be results that are confusing or concerning to you. Not all laboratory results come back in the same time frame and the provider may be waiting for multiple results in order to interpret others.  Please give [place] 48 hours in order for your provider to thoroughly review all the results before contacting the office for clarification of your results.   It was a pleasure to see you today!  Thank you for trusting me with your gastrointestinal care!

## 2021-08-14 ENCOUNTER — Encounter: Payer: Self-pay | Admitting: Gastroenterology

## 2021-08-14 ENCOUNTER — Ambulatory Visit (AMBULATORY_SURGERY_CENTER): Payer: 59 | Admitting: Gastroenterology

## 2021-08-14 VITALS — BP 130/79 | HR 73 | Temp 98.7°F | Resp 24 | Ht 71.0 in | Wt 281.0 lb

## 2021-08-14 DIAGNOSIS — R1013 Epigastric pain: Secondary | ICD-10-CM

## 2021-08-14 MED ORDER — SODIUM CHLORIDE 0.9 % IV SOLN: 500.0000 mL | Freq: Once | INTRAVENOUS | Status: DC

## 2021-08-14 NOTE — Progress Notes (Signed)
No changes to clinical history since GI office visit on 08/07/21.  The patient is appropriate for an endoscopic procedure in the ambulatory setting.  - Wilfrid Lund, MD

## 2021-08-14 NOTE — Patient Instructions (Signed)
YOU HAD AN ENDOSCOPIC PROCEDURE TODAY: Refer to the procedure report and other information in the discharge instructions given to you for any specific questions about what was found during the examination. If this information does not answer your questions, please call [place] office at [phone] to clarify.   YOU SHOULD EXPECT: Some feelings of bloating in the abdomen. Passage of more gas than usual. Walking can help get rid of the air that was put into your GI tract during the procedure and reduce the bloating. If you had a lower endoscopy (such as a colonoscopy or flexible sigmoidoscopy) you may notice spotting of blood in your stool or on the toilet paper. Some abdominal soreness may be present for a day or two, also.  DIET: Your first meal following the procedure should be a light meal and then it is ok to progress to your normal diet. A half-sandwich or bowl of soup is an example of a good first meal. Heavy or fried foods are harder to digest and may make you feel nauseous or bloated. Drink plenty of fluids but you should avoid alcoholic beverages for 24 hours. If you had a esophageal dilation, please see attached instructions for diet.    ACTIVITY: Your care partner should take you home directly after the procedure. You should plan to take it easy, moving slowly for the rest of the day. You can resume normal activity the day after the procedure however YOU SHOULD NOT DRIVE, use power tools, machinery or perform tasks that involve climbing or major physical exertion for 24 hours (because of the sedation medicines used during the test).   SYMPTOMS TO REPORT IMMEDIATELY: A gastroenterologist can be reached at any hour. Please call [phone]  for any of the following symptoms:   Following upper endoscopy (EGD, EUS, ERCP, esophageal dilation) Vomiting of blood or coffee ground material  New, significant abdominal pain  New, significant chest pain or pain under the shoulder blades  Painful or  persistently difficult swallowing  New shortness of breath  Black, tarry-looking or red, bloody stools  FOLLOW UP:  If any biopsies were taken you will be contacted by phone or by letter within the next 1-3 weeks. Call [phone]  if you have not heard about the biopsies in 3 weeks.  Please also call with any specific questions about appointments or follow up tests.  

## 2021-08-14 NOTE — Progress Notes (Signed)
A and O x3. Report to RN. Tolerated MAC anesthesia well.Teeth unchanged after procedure. 

## 2021-08-14 NOTE — Progress Notes (Signed)
VS by DT  Pt's states no medical or surgical changes since previsit or office visit.  

## 2021-08-14 NOTE — Op Note (Signed)
[facility] Patient Name: Bryce Nguyen Procedure Date: 08/14/2021 9:43 AM MRN: [id number] Endoscopist: Mallie Mussel L. Loletha Carrow , MD Age: 26 Referring MD:  Date of Birth: 1995-11-03 Gender: Male Account #: [id number] Procedure:                Upper GI endoscopy Indications:              Epigastric abdominal pain Medicines:                Monitored Anesthesia Care Procedure:                Pre-Anesthesia Assessment:                           - Prior to the procedure, a History and Physical                            was performed, and patient medications and                            allergies were reviewed. The patient's tolerance of                            previous anesthesia was also reviewed. The risks                            and benefits of the procedure and the sedation                            options and risks were discussed with the patient.                            All questions were answered, and informed consent                            was obtained. Prior Anticoagulants: The patient has                            taken no previous anticoagulant or antiplatelet                            agents. ASA Grade Assessment: II - A patient with                            mild systemic disease. After reviewing the risks                            and benefits, the patient was deemed in                            satisfactory condition to undergo the procedure.                           After obtaining informed consent, the endoscope was  passed under direct vision. Throughout the                            procedure, the patient's blood pressure, pulse, and                            oxygen saturations were monitored continuously. The                            GIF [id number] #[id number] was introduced through the                            mouth, and advanced to the second part of duodenum.                            The upper GI endoscopy was  accomplished without                            difficulty. The patient tolerated the procedure                            well. Scope In: Scope Out: Findings:                 The larynx was normal.                           The esophagus was normal.                           The entire examined stomach was normal. Several                            biopsies were obtained on the greater curvature of                            the gastric body, on the lesser curvature of the                            gastric body, on the greater curvature of the                            gastric antrum and on the lesser curvature of the                            gastric antrum with cold forceps for histology.                           The cardia and gastric fundus were normal on                            retroflexion.                           The examined duodenum was normal. Complications:  No immediate complications. Estimated Blood Loss:     Estimated blood loss was minimal. Impression:               - Normal larynx.                           - Normal esophagus.                           - Normal stomach.                           - Normal examined duodenum.                           - Several biopsies were obtained on the greater                            curvature of the gastric body, on the lesser                            curvature of the gastric body, on the greater                            curvature of the gastric antrum and on the lesser                            curvature of the gastric antrum. Recommendation:           - Patient has a contact number available for                            emergencies. The signs and symptoms of potential                            delayed complications were discussed with the                            patient. Return to normal activities tomorrow.                            Written discharge instructions were provided to the                             patient.                           - Resume previous diet.                           - Continue present medications.                           - Await pathology results. Sherree Shankman L. Loletha Carrow, MD 08/14/2021 10:05:53 AM This report has been signed electronically.

## 2021-08-17 ENCOUNTER — Telehealth: Payer: Self-pay | Admitting: *Deleted

## 2021-08-17 NOTE — Telephone Encounter (Signed)
  Follow up Call-     08/14/2021    9:23 AM  Call back number  Post procedure Call Back phone  # [phone]  Permission to leave phone message Yes     Patient questions:  Do you have a fever, pain , or abdominal swelling? No. Pain Score  0 *  Have you tolerated food without any problems? Yes.    Have you been able to return to your normal activities? Yes.    Do you have any questions about your discharge instructions: Diet   No. Medications  No. Follow up visit  No.  Do you have questions or concerns about your Care? No.  Actions: * If pain score is 4 or above: No action needed, pain <4.

## 2021-08-19 ENCOUNTER — Other Ambulatory Visit: Payer: Self-pay

## 2021-08-19 DIAGNOSIS — R1013 Epigastric pain: Secondary | ICD-10-CM

## 2021-09-30 ENCOUNTER — Ambulatory Visit (HOSPITAL_COMMUNITY)
Admission: RE | Admit: 2021-09-30 | Discharge: 2021-09-30 | Disposition: A | Discharge status: Home / Self Care | Payer: 59 | Source: Ambulatory Visit | Attending: Gastroenterology | Admitting: Gastroenterology

## 2021-09-30 ENCOUNTER — Encounter (HOSPITAL_COMMUNITY): Payer: Self-pay

## 2021-09-30 DIAGNOSIS — R1013 Epigastric pain: Secondary | ICD-10-CM

## 2021-10-06 ENCOUNTER — Ambulatory Visit: Payer: 59 | Admitting: Gastroenterology

## 2021-10-06 ENCOUNTER — Encounter: Payer: Self-pay | Admitting: Gastroenterology

## 2021-10-06 VITALS — BP 132/86 | HR 90 | Ht 71.0 in | Wt 282.0 lb

## 2021-10-06 DIAGNOSIS — R1013 Epigastric pain: Secondary | ICD-10-CM | POA: Diagnosis not present

## 2021-10-06 NOTE — Progress Notes (Signed)
     [facility] Progress Note  Chief Complaint: Epigastric pain  Subjective  History: Bryce Nguyen was first seen in early June for several years of episodic sharp and burning upper abdominal pain perhaps associated with anxiety at times but not always clear triggers.  Normal ultrasound 2021. Little or no improvement from acid suppression and Carafate. EGD on 08/14/2021 was normal, and gastric biopsies negative for H. pylori.  RUQ ultrasound recommended (no report)  Bryce Nguyen is feeling better since I last saw him.  He made some diet changes and stop smoking cigars.  He also feels that episodes of stress are contributing to the abdominal.  He was scheduled to get the ultrasound done but there was some misunderstanding regarding the time of arrival that day and, since he was feeling better, he did not reschedule it.  ROS: Cardiovascular:  no chest pain Respiratory: no dyspnea  The patient's Past Medical, Family and Social History were reviewed and are on file in the EMR.  Objective:  Med list reviewed  Current Outpatient Medications:    buPROPion (WELLBUTRIN XL) 150 MG 24 hr tablet, Take 150 mg by mouth every morning., Disp: , Rfl:    pantoprazole (PROTONIX) 20 MG tablet, Take 20 mg by mouth daily. (Patient not taking: Reported on 10/06/2021), Disp: , Rfl:    Vital signs in last 24 hrs: Vitals:   10/06/21 1359  BP: 132/86  Pulse: 90   Wt Readings from Last 3 Encounters:  10/06/21 282 lb (127.9 kg)  08/14/21 281 lb (127.5 kg)  08/07/21 281 lb (127.5 kg)    Physical Exam  Well-appearing Cardiac: Regular without murmur,  no peripheral edema Pulm: clear to auscultation bilaterally, normal RR and effort noted Abdomen: soft, no tenderness, with active bowel sounds. No guarding or palpable hepatosplenomegaly.  Labs:   ___________________________________________ Radiologic  studies:   ____________________________________________ Other:   _____________________________________________ Assessment & Plan  Assessment: Encounter Diagnosis  Name Primary?   Epigastric pain Yes   Nonulcer dyspepsia, H. pylori negative.  It does not sound typical for biliary colic other than it being episodic in the past.  Should this pain escalate, then I would recommend scheduling the right upper quadrant ultrasound.  Continue healthy diet and lifestyle choices and see me as needed.  Stop pantoprazole.  Bryce Nguyen

## 2021-10-06 NOTE — Patient Instructions (Signed)
If you are age 23 or older, your body mass index should be between 23-30. Your Body mass index is 39.33 kg/m. If this is out of the aforementioned range listed, please consider follow up with your Primary Care Provider.  If you are age 79 or younger, your body mass index should be between 19-25. Your Body mass index is 39.33 kg/m. If this is out of the aformentioned range listed, please consider follow up with your Primary Care Provider.   ________________________________________________________  The [facility] providers would like to encourage you to use [facility] to communicate with providers for non-urgent requests or questions.  Due to long hold times on the telephone, sending your provider a message by [facility] may be a faster and more efficient way to get a response.  Please allow 48 business hours for a response.  Please remember that this is for non-urgent requests.  _______________________________________________________  Follow up as needed.  It was a pleasure to see you today!  Thank you for trusting me with your gastrointestinal care!
